# Patient Record
Sex: Female | Born: 1965 | Race: White | Hispanic: No | Marital: Married
Health system: Southern US, Community
[De-identification: ages and names within clinical notes are randomized; demographics above are authoritative.]

---

## 1997-10-06 ENCOUNTER — Other Ambulatory Visit: Admission: RE | Admit: 1997-10-06 | Discharge: 1997-10-06 | Payer: Self-pay | Admitting: Obstetrics and Gynecology

## 1999-07-26 ENCOUNTER — Other Ambulatory Visit: Admission: RE | Admit: 1999-07-26 | Discharge: 1999-07-26 | Payer: Self-pay | Admitting: Obstetrics and Gynecology

## 2000-01-04 ENCOUNTER — Other Ambulatory Visit: Admission: RE | Admit: 2000-01-04 | Discharge: 2000-01-04 | Payer: Self-pay | Admitting: Obstetrics and Gynecology

## 2000-07-17 ENCOUNTER — Inpatient Hospital Stay (HOSPITAL_COMMUNITY): Admission: AD | Admit: 2000-07-17 | Discharge: 2000-07-19 | Payer: Self-pay | Admitting: Obstetrics and Gynecology

## 2000-08-15 ENCOUNTER — Other Ambulatory Visit: Admission: RE | Admit: 2000-08-15 | Discharge: 2000-08-15 | Payer: Self-pay | Admitting: Obstetrics and Gynecology

## 2002-10-15 ENCOUNTER — Other Ambulatory Visit: Admission: RE | Admit: 2002-10-15 | Discharge: 2002-10-15 | Payer: Self-pay | Admitting: Obstetrics and Gynecology

## 2004-05-27 ENCOUNTER — Other Ambulatory Visit: Admission: RE | Admit: 2004-05-27 | Discharge: 2004-05-27 | Payer: Self-pay | Admitting: Obstetrics and Gynecology

## 2005-08-11 ENCOUNTER — Other Ambulatory Visit: Admission: RE | Admit: 2005-08-11 | Discharge: 2005-08-11 | Payer: Self-pay | Admitting: Obstetrics and Gynecology

## 2007-08-13 ENCOUNTER — Encounter: Admission: RE | Admit: 2007-08-13 | Discharge: 2007-08-13 | Payer: Self-pay | Admitting: Obstetrics and Gynecology

## 2007-08-20 ENCOUNTER — Encounter: Admission: RE | Admit: 2007-08-20 | Discharge: 2007-08-20 | Payer: Self-pay | Admitting: Obstetrics and Gynecology

## 2008-03-09 ENCOUNTER — Encounter: Admission: RE | Admit: 2008-03-09 | Discharge: 2008-03-09 | Payer: Self-pay | Admitting: Obstetrics and Gynecology

## 2008-08-13 ENCOUNTER — Encounter: Admission: RE | Admit: 2008-08-13 | Discharge: 2008-08-13 | Payer: Self-pay | Admitting: Obstetrics and Gynecology

## 2009-02-09 ENCOUNTER — Encounter: Admission: RE | Admit: 2009-02-09 | Discharge: 2009-02-09 | Payer: Self-pay | Admitting: Obstetrics and Gynecology

## 2009-08-19 ENCOUNTER — Encounter: Admission: RE | Admit: 2009-08-19 | Discharge: 2009-08-19 | Payer: Self-pay | Admitting: Obstetrics and Gynecology

## 2009-08-26 ENCOUNTER — Encounter: Admission: RE | Admit: 2009-08-26 | Discharge: 2009-08-26 | Payer: Self-pay | Admitting: Obstetrics and Gynecology

## 2010-07-11 ENCOUNTER — Other Ambulatory Visit: Payer: Self-pay | Admitting: Obstetrics and Gynecology

## 2010-07-11 DIAGNOSIS — Z1231 Encounter for screening mammogram for malignant neoplasm of breast: Secondary | ICD-10-CM

## 2010-09-05 ENCOUNTER — Ambulatory Visit
Admission: RE | Admit: 2010-09-05 | Discharge: 2010-09-05 | Disposition: A | Discharge status: Home / Self Care | Payer: PRIVATE HEALTH INSURANCE | Source: Ambulatory Visit | Attending: Obstetrics and Gynecology | Admitting: Obstetrics and Gynecology

## 2010-09-05 DIAGNOSIS — Z1231 Encounter for screening mammogram for malignant neoplasm of breast: Secondary | ICD-10-CM

## 2011-05-19 ENCOUNTER — Ambulatory Visit: Payer: Self-pay | Admitting: Obstetrics and Gynecology

## 2011-08-23 ENCOUNTER — Other Ambulatory Visit: Payer: Self-pay | Admitting: Obstetrics and Gynecology

## 2011-08-23 DIAGNOSIS — Z1231 Encounter for screening mammogram for malignant neoplasm of breast: Secondary | ICD-10-CM

## 2011-09-07 ENCOUNTER — Ambulatory Visit: Payer: PRIVATE HEALTH INSURANCE

## 2011-09-08 ENCOUNTER — Ambulatory Visit
Admission: RE | Admit: 2011-09-08 | Discharge: 2011-09-08 | Disposition: A | Discharge status: Home / Self Care | Payer: PRIVATE HEALTH INSURANCE | Source: Ambulatory Visit | Attending: Obstetrics and Gynecology | Admitting: Obstetrics and Gynecology

## 2011-09-08 DIAGNOSIS — Z1231 Encounter for screening mammogram for malignant neoplasm of breast: Secondary | ICD-10-CM

## 2013-04-07 ENCOUNTER — Other Ambulatory Visit: Payer: Self-pay

## 2013-04-07 DIAGNOSIS — Z1231 Encounter for screening mammogram for malignant neoplasm of breast: Secondary | ICD-10-CM

## 2013-04-24 ENCOUNTER — Ambulatory Visit: Payer: PRIVATE HEALTH INSURANCE

## 2013-05-16 ENCOUNTER — Ambulatory Visit: Payer: PRIVATE HEALTH INSURANCE

## 2013-06-16 ENCOUNTER — Ambulatory Visit
Admission: RE | Admit: 2013-06-16 | Discharge: 2013-06-16 | Disposition: A | Discharge status: Home / Self Care | Payer: BC Managed Care – PPO | Source: Ambulatory Visit

## 2013-06-16 DIAGNOSIS — Z1231 Encounter for screening mammogram for malignant neoplasm of breast: Secondary | ICD-10-CM

## 2015-02-28 IMAGING — MG MM DIGITAL SCREENING BILAT W/ CAD
4 series · 4 of 4 positions shown · non-contrast
Comparison: Previous exam(s).

CLINICAL DATA: Screening.

EXAM:
DIGITAL SCREENING BILATERAL MAMMOGRAM WITH CAD

[R CC]
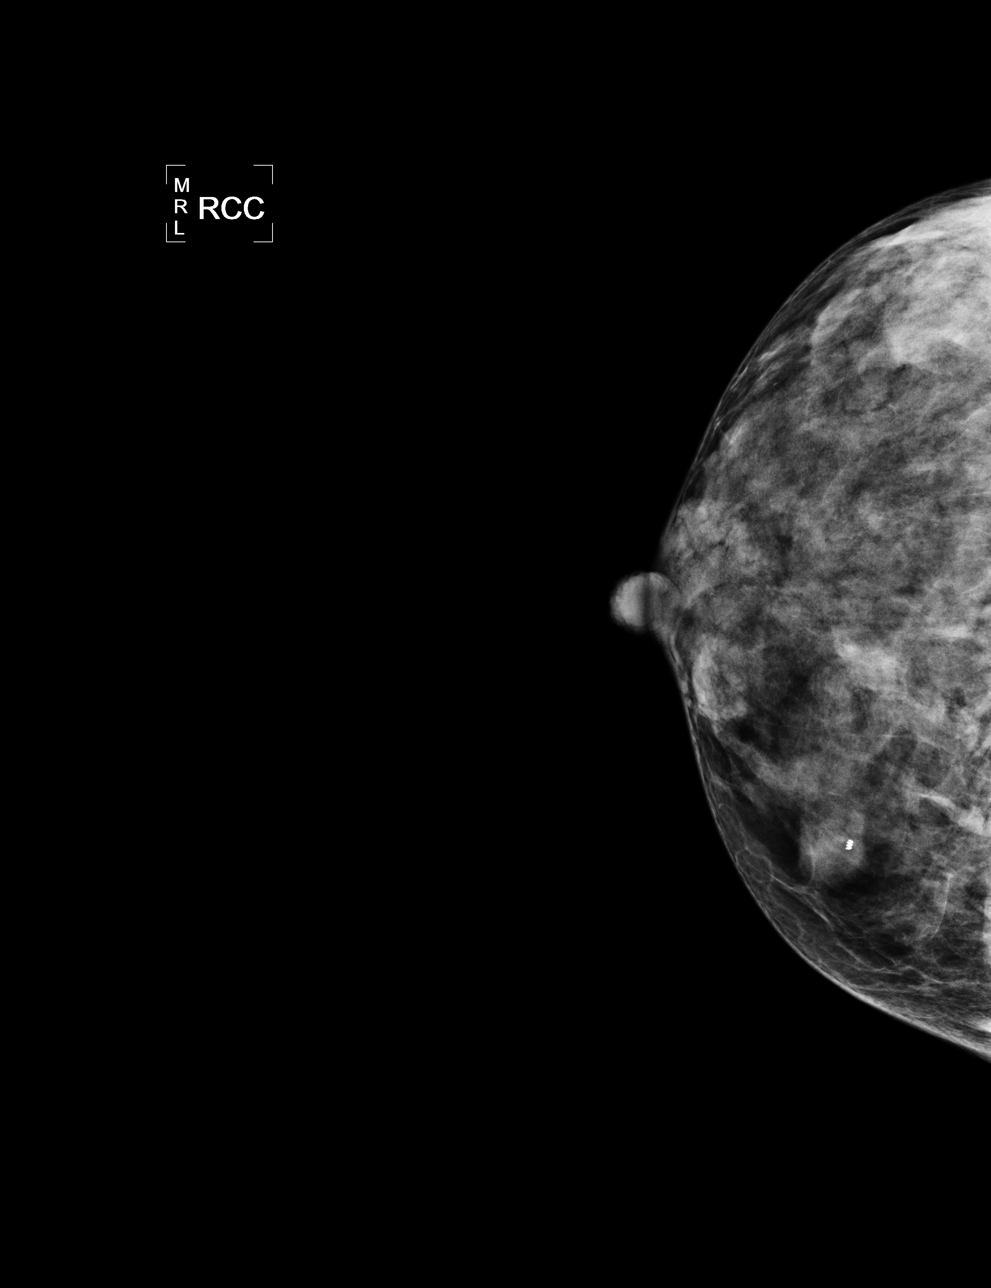

[L CC]
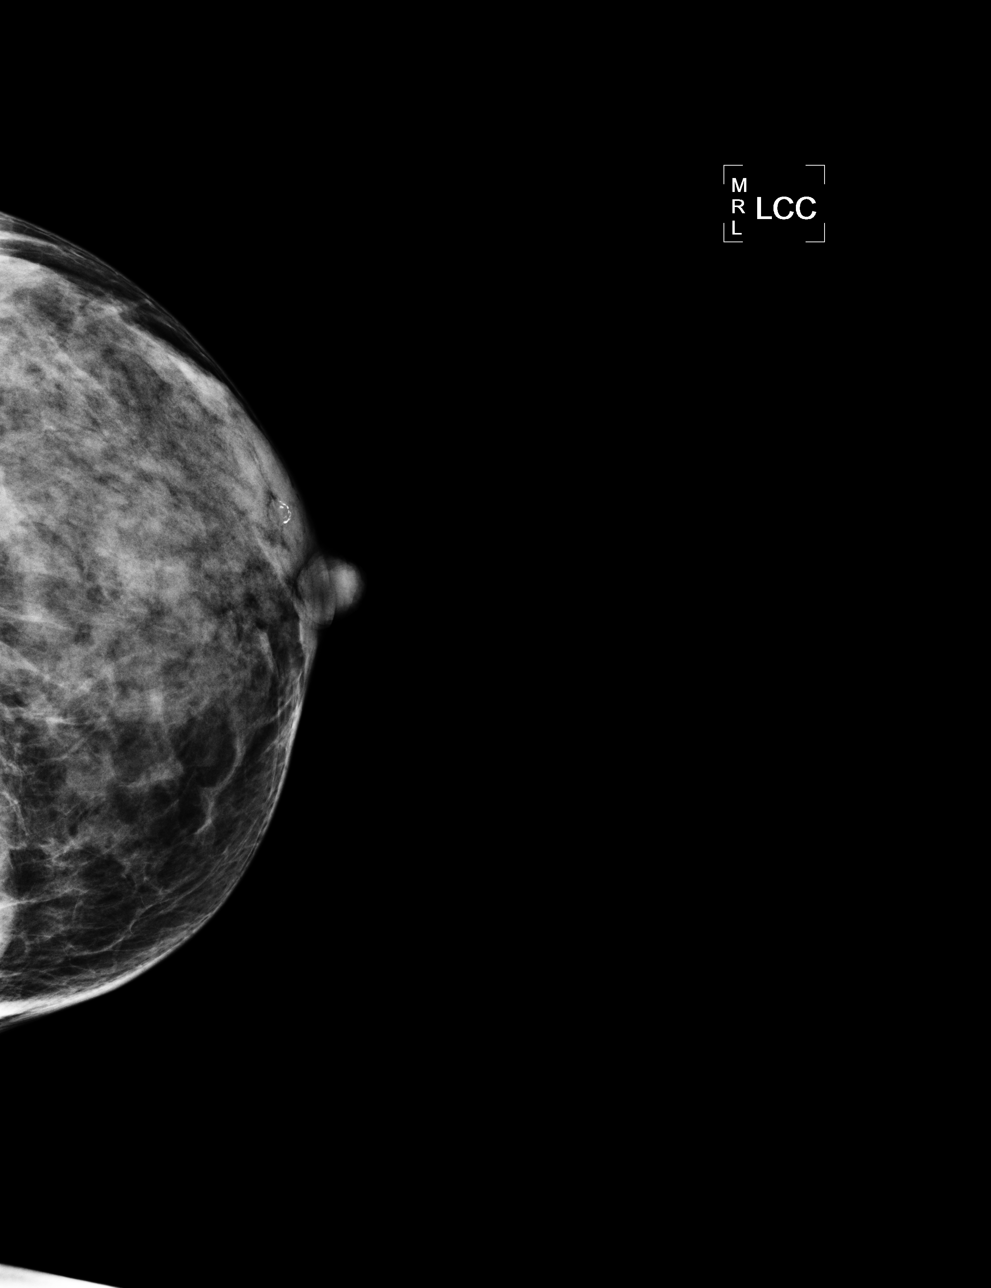

[L MLO]
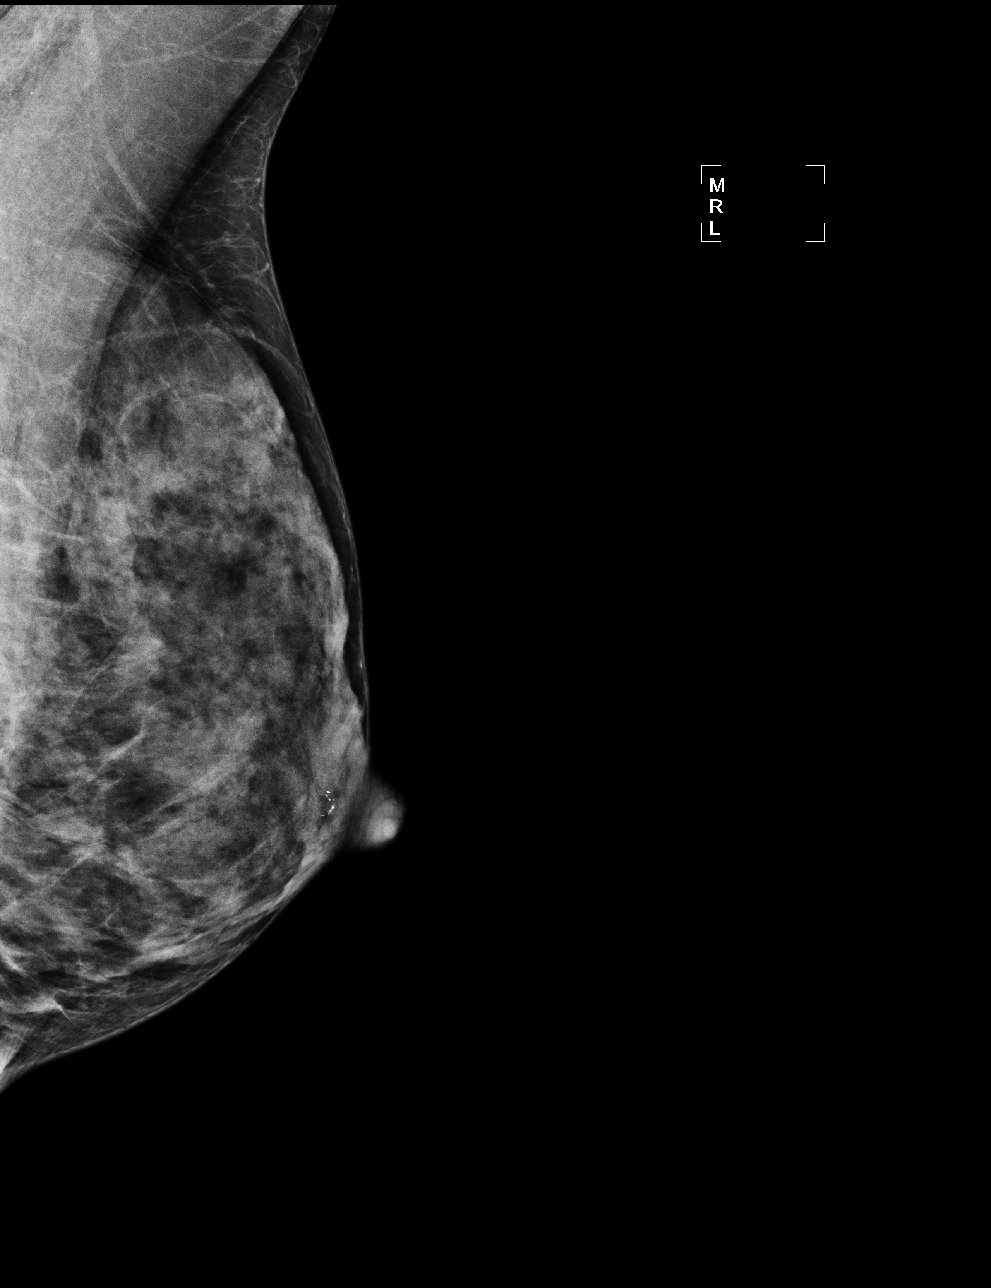

[R MLO]
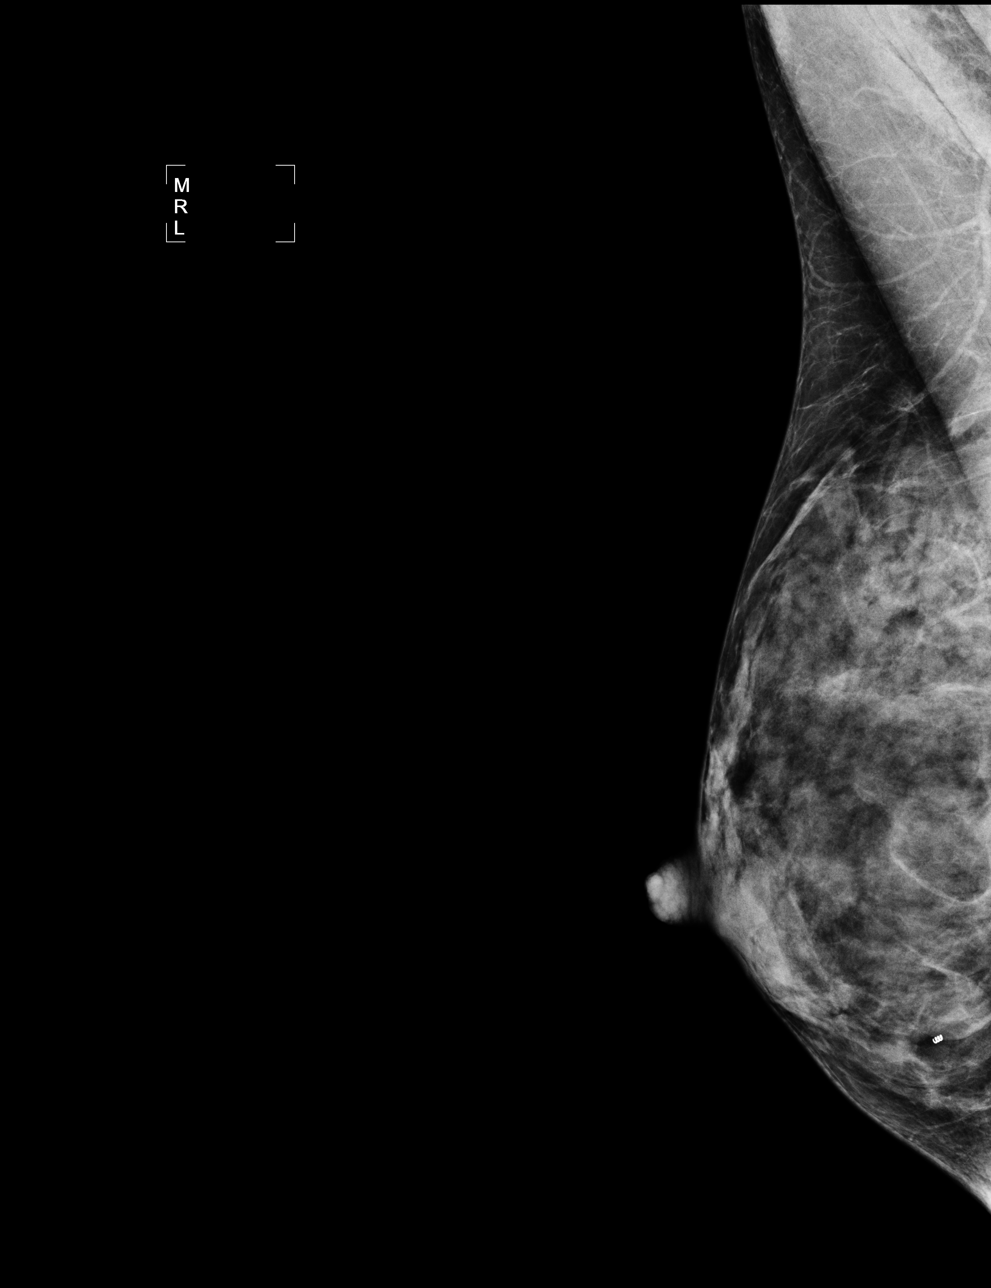

[4 of 4 positions shown; findings below may reference images not displayed]

ACR Breast Density Category d: The breast tissue is extremely dense,
which lowers the sensitivity of mammography.
FINDINGS: There are no findings suspicious for malignancy. Images were
processed with CAD.
IMPRESSION: No mammographic evidence of malignancy. A result letter of this
screening mammogram will be mailed directly to the patient.

RECOMMENDATION:
Screening mammogram in one year. (Code:[id number])

BI-RADS CATEGORY  1: Negative.

## 2021-11-15 ENCOUNTER — Telehealth: Payer: Self-pay | Admitting: Hematology and Oncology

## 2021-11-15 NOTE — Telephone Encounter (Signed)
Scheduled appt per 9/12 referral. Pt is aware of appt date and time. Pt is aware to arrive 15 mins prior to appt time and to bring and updated insurance card. Pt is aware of appt location.   

## 2021-11-30 ENCOUNTER — Other Ambulatory Visit: Payer: Self-pay | Admitting: Hematology and Oncology

## 2021-11-30 ENCOUNTER — Inpatient Hospital Stay: Payer: Self-pay | Attending: Hematology and Oncology | Admitting: Hematology and Oncology

## 2021-11-30 ENCOUNTER — Inpatient Hospital Stay: Payer: Self-pay

## 2021-11-30 ENCOUNTER — Other Ambulatory Visit: Payer: Self-pay

## 2021-11-30 ENCOUNTER — Encounter: Payer: Self-pay | Admitting: Hematology and Oncology

## 2021-11-30 VITALS — BP 149/91 | HR 105 | Temp 98.1°F | Resp 18 | Ht 64.0 in | Wt 122.1 lb

## 2021-11-30 DIAGNOSIS — D696 Thrombocytopenia, unspecified: Secondary | ICD-10-CM

## 2021-11-30 LAB — COMPREHENSIVE METABOLIC PANEL
ALT: 132 U/L — ABNORMAL HIGH (ref 0–44)
AST: 133 U/L — ABNORMAL HIGH (ref 15–41)
Albumin: 4.5 g/dL (ref 3.5–5.0)
Alkaline Phosphatase: 66 U/L (ref 38–126)
Anion gap: 7 (ref 5–15)
BUN: 21 mg/dL — ABNORMAL HIGH (ref 6–20)
CO2: 27 mmol/L (ref 22–32)
Calcium: 9.2 mg/dL (ref 8.9–10.3)
Chloride: 104 mmol/L (ref 98–111)
Creatinine, Ser: 0.83 mg/dL (ref 0.44–1.00)
GFR, Estimated: >60 mL/min (ref >60)
Glucose, Bld: 124 mg/dL — ABNORMAL HIGH (ref 70–99)
Potassium: 3.6 mmol/L (ref 3.5–5.1)
Sodium: 138 mmol/L (ref 135–145)
Total Bilirubin: 0.9 mg/dL (ref 0.3–1.2)
Total Protein: 6.8 g/dL (ref 6.5–8.1)

## 2021-11-30 LAB — CBC WITH DIFFERENTIAL/PLATELET
Abs Immature Granulocytes: 0.01 10*3/uL (ref 0.00–0.07)
Basophils Absolute: 0.1 10*3/uL (ref 0.0–0.1)
Basophils Relative: 2 %
Eosinophils Absolute: 0.1 10*3/uL (ref 0.0–0.5)
Eosinophils Relative: 2 %
HCT: 41.1 % (ref 36.0–46.0)
Hemoglobin: 14.9 g/dL (ref 12.0–15.0)
Immature Granulocytes: 0 %
Lymphocytes Relative: 21 %
Lymphs Abs: 1.2 10*3/uL (ref 0.7–4.0)
MCH: 33.8 pg (ref 26.0–34.0)
MCHC: 36.3 g/dL — ABNORMAL HIGH (ref 30.0–36.0)
MCV: 93.2 fL (ref 80.0–100.0)
Monocytes Absolute: 0.5 10*3/uL (ref 0.1–1.0)
Monocytes Relative: 9 %
Neutro Abs: 3.9 10*3/uL (ref 1.7–7.7)
Neutrophils Relative %: 66 %
Platelets: 129 10*3/uL — ABNORMAL LOW (ref 150–400)
RBC: 4.41 MIL/uL (ref 3.87–5.11)
RDW: 12.1 % (ref 11.5–15.5)
Smear Review: NORMAL
WBC: 5.9 10*3/uL (ref 4.0–10.5)
nRBC: 0 % (ref 0.0–0.2)

## 2021-11-30 LAB — TECHNOLOGIST SMEAR REVIEW: Plt Morphology: NORMAL

## 2021-11-30 LAB — HEPATITIS PANEL, ACUTE
HCV Ab: NONREACTIVE
Hep A IgM: NONREACTIVE
Hep B C IgM: NONREACTIVE
Hepatitis B Surface Ag: NONREACTIVE

## 2021-11-30 LAB — LACTATE DEHYDROGENASE: LDH: 188 U/L (ref 98–192)

## 2021-11-30 LAB — IRON AND IRON BINDING CAPACITY (CC-WL,HP ONLY)
Iron: 122 ug/dL (ref 28–170)
Saturation Ratios: 36 % — ABNORMAL HIGH (ref 10.4–31.8)
TIBC: 336 ug/dL (ref 250–450)
UIBC: 214 ug/dL (ref 148–442)

## 2021-11-30 LAB — FERRITIN: Ferritin: 88 ng/mL (ref 11–307)

## 2021-11-30 LAB — VITAMIN B12: Vitamin B-12: 802 pg/mL (ref 180–914)

## 2021-11-30 NOTE — Progress Notes (Signed)
[place] NOTE  Patient Care Team: [facility], [facility] as PCP - General  CHIEF COMPLAINTS/PURPOSE OF CONSULTATION:  Thrombocytopenia  ASSESSMENT & PLAN:   This is a very pleasant 56 year old female patient who was referred to hematology for evaluation of thrombocytopenia.  We have discussed the following findings about thrombocytopenia. Thrombocytopenia is defined as a platelet count below the lower limit of normal (ie, <150,000/microL [150 x 109/L] for adults).  Degrees of thrombocytopenia can be further subdivided into mild (platelet count 100,000 to 150,000/microL), moderate (50,000 to 99,000/microL), and severe (<50,000/microL) Most common causes of thrombocytopenia include but not limited to chronic liver disease or hypersplenism, immune thrombocytopenia, viral infections such as Hepatitis, HIV, active bacterial infections, autoimmune diseases, alcohol, nutritional deficiencies and medications. Rarely bone marrow disorders such as myelodysplatic syndrome, bone marrow failure syndromes, acute leukemia and PNH can present with thrombocytopenia. Additional rare causes of thrombocytopenia include vascular conditions associated with platelet destruction (eg, giant capillary hemangioma, large aortic aneurysms, cardiopulmonary bypass, intraaortic balloon pumps.  We will proceed with some laboratory evaluation today.  She will have a televisit in about a week to 10 days to review the results and to discuss other treatment recommendations.  She is otherwise up-to-date with age-appropriate cancer screening. Thank you for consulting [place] the care of this patient.  Please do not hesitate to contact us with any additional questions or concerns.  HISTORY OF PRESENTING ILLNESS:   This is a very pleasant 56 year old female patient with no significant past medical history referred to hematology for evaluation of thrombocytopenia.  According to the patient, this  thrombocytopenia was most recently noted on an annual visit.  She denies any health complaints.  No new medications, recent infections or known autoimmune diseases.  She was taking some over-the-counter vitamins which she has now discontinued.  No fevers or drenching night sweats, has some hot flashes from postmenopausal symptoms.  No unintentional weight loss.  She works for [employer]. She denies any change in breathing.  No bleeding in her stool or in her urine.  No new neurological complaints.  Rest of the pertinent 10 point ROS reviewed and negative  REVIEW OF SYSTEMS:   Constitutional: Denies fevers, chills or abnormal night sweats Eyes: Denies blurriness of vision, double vision or watery eyes Ears, nose, mouth, throat, and face: Denies mucositis or sore throat Respiratory: Denies cough, dyspnea or wheezes Cardiovascular: Denies palpitation, chest discomfort or lower extremity swelling Gastrointestinal:  Denies nausea, heartburn or change in bowel habits Skin: Denies abnormal skin rashes Lymphatics: Denies new lymphadenopathy or easy bruising Neurological:Denies numbness, tingling or new weaknesses Behavioral/Psych: Mood is stable, no new changes  All other systems were reviewed with the patient and are negative.  MEDICAL HISTORY:  No significant PMH  SURGICAL HISTORY: History reviewed. No pertinent surgical history.  SOCIAL HISTORY: Social History   Socioeconomic History   Marital status: Married    Spouse name: Not on file   Number of children: Not on file   Years of education: Not on file   Highest education level: Not on file  Occupational History   Not on file  Tobacco Use   Smoking status: Not on file   Smokeless tobacco: Not on file  Substance and Sexual Activity   Alcohol use: Not on file   Drug use: Not on file   Sexual activity: Not on file  Other Topics Concern   Not on file  Social History Narrative   Not on file  Social Determinants of Health    Financial Resource Strain: Not on file  Food Insecurity: Not on file  Transportation Needs: Not on file  Physical Activity: Not on file  Stress: Not on file  Social Connections: Not on file  Intimate Partner Violence: Not on file    FAMILY HISTORY: History reviewed. No pertinent family history.  ALLERGIES:  has No Known Allergies.  MEDICATIONS:  No current outpatient medications on file.   No current facility-administered medications for this visit.     PHYSICAL EXAMINATION: ECOG PERFORMANCE STATUS: 0 - Asymptomatic  Vitals:   11/30/21 1050  BP: (!) 149/91  Pulse: (!) 105  Resp: 18  Temp: 98.1 F (36.7 C)  SpO2: 99%   Filed Weights   11/30/21 1050  Weight: 122 lb 1.6 oz (55.4 kg)    Physical Exam Constitutional:      Appearance: Normal appearance.  Cardiovascular:     Rate and Rhythm: Normal rate and regular rhythm.  Pulmonary:     Effort: Pulmonary effort is normal. No respiratory distress.     Breath sounds: Normal breath sounds.  Abdominal:     General: Abdomen is flat. Bowel sounds are normal.  Musculoskeletal:     Cervical back: Normal range of motion and neck supple. No rigidity.  Lymphadenopathy:     Cervical: No cervical adenopathy.  Skin:    General: Skin is warm and dry.  Neurological:     General: No focal deficit present.     Mental Status: She is alert.  Psychiatric:        Mood and Affect: Mood normal.    LABORATORY DATA:  I have reviewed the data as listed No results found for: "WBC", "HGB", "HCT", "MCV", "PLT"   Chemistry   No results found for: "NA", "K", "CL", "CO2", "BUN", "CREATININE", "GLU" No results found for: "CALCIUM", "ALKPHOS", "AST", "ALT", "BILITOT"     RADIOGRAPHIC STUDIES: I have personally reviewed the radiological images as listed and agreed with the findings in the report. No results found.  All questions were answered. The patient knows to call the clinic with any problems, questions or concerns. I spent  45 minutes in the care of this patient including H and P, review of records, counseling and coordination of care.     Rachel Moulds, MD 11/30/2021 11:14 AM

## 2021-12-01 ENCOUNTER — Other Ambulatory Visit: Payer: Self-pay | Admitting: Hematology and Oncology

## 2021-12-01 DIAGNOSIS — D696 Thrombocytopenia, unspecified: Secondary | ICD-10-CM

## 2021-12-01 LAB — FOLATE RBC
Folate, Hemolysate: 544 ng/mL
Folate, RBC: 1242 ng/mL (ref >498)
Hematocrit: 43.8 % (ref 34.0–46.6)

## 2021-12-01 NOTE — Progress Notes (Signed)
CBC and CMP orders placed.

## 2021-12-02 LAB — ANTINUCLEAR ANTIBODIES, IFA: ANA Ab, IFA: NEGATIVE

## 2021-12-06 ENCOUNTER — Encounter: Payer: Self-pay | Admitting: Hematology and Oncology

## 2021-12-09 ENCOUNTER — Inpatient Hospital Stay: Payer: Self-pay

## 2021-12-09 DIAGNOSIS — D696 Thrombocytopenia, unspecified: Secondary | ICD-10-CM

## 2021-12-09 LAB — COMPREHENSIVE METABOLIC PANEL
ALT: 75 U/L — ABNORMAL HIGH (ref 0–44)
AST: 51 U/L — ABNORMAL HIGH (ref 15–41)
Albumin: 4.2 g/dL (ref 3.5–5.0)
Alkaline Phosphatase: 60 U/L (ref 38–126)
Anion gap: 6 (ref 5–15)
BUN: 13 mg/dL (ref 6–20)
CO2: 27 mmol/L (ref 22–32)
Calcium: 9.1 mg/dL (ref 8.9–10.3)
Chloride: 106 mmol/L (ref 98–111)
Creatinine, Ser: 0.69 mg/dL (ref 0.44–1.00)
GFR, Estimated: >60 mL/min (ref >60)
Glucose, Bld: 118 mg/dL — ABNORMAL HIGH (ref 70–99)
Potassium: 3.6 mmol/L (ref 3.5–5.1)
Sodium: 139 mmol/L (ref 135–145)
Total Bilirubin: 0.8 mg/dL (ref 0.3–1.2)
Total Protein: 6.8 g/dL (ref 6.5–8.1)

## 2021-12-09 LAB — CBC WITH DIFFERENTIAL/PLATELET
Abs Immature Granulocytes: 0.01 10*3/uL (ref 0.00–0.07)
Basophils Absolute: 0.1 10*3/uL (ref 0.0–0.1)
Basophils Relative: 2 %
Eosinophils Absolute: 0.1 10*3/uL (ref 0.0–0.5)
Eosinophils Relative: 3 %
HCT: 40.6 % (ref 36.0–46.0)
Hemoglobin: 14.5 g/dL (ref 12.0–15.0)
Immature Granulocytes: 0 %
Lymphocytes Relative: 29 %
Lymphs Abs: 1.4 10*3/uL (ref 0.7–4.0)
MCH: 33.6 pg (ref 26.0–34.0)
MCHC: 35.7 g/dL (ref 30.0–36.0)
MCV: 94 fL (ref 80.0–100.0)
Monocytes Absolute: 0.5 10*3/uL (ref 0.1–1.0)
Monocytes Relative: 9 %
Neutro Abs: 2.8 10*3/uL (ref 1.7–7.7)
Neutrophils Relative %: 57 %
Platelets: 126 10*3/uL — ABNORMAL LOW (ref 150–400)
RBC: 4.32 MIL/uL (ref 3.87–5.11)
RDW: 12.4 % (ref 11.5–15.5)
WBC: 4.8 10*3/uL (ref 4.0–10.5)
nRBC: 0 % (ref 0.0–0.2)

## 2021-12-16 ENCOUNTER — Telehealth: Payer: Self-pay | Admitting: *Deleted

## 2021-12-16 NOTE — Telephone Encounter (Addendum)
-----   Message from Benay Pike, MD sent at 12/13/2021  4:22 PM EDT ----- Looks like LFT has improved with reduction in alcohol consumption.  She should minimize alcohol consumption to 1 glass a day or up to 3 times a week.  Platelet count is stable.    We can move the October 24 appointment to maybe December for follow-up.  This RN called pt and obtained identified VM- message left stating noted improvement and to continue her current plan. This RN left her name and return call number.

## 2021-12-20 ENCOUNTER — Inpatient Hospital Stay (HOSPITAL_BASED_OUTPATIENT_CLINIC_OR_DEPARTMENT_OTHER): Payer: Self-pay | Admitting: Hematology and Oncology

## 2021-12-20 ENCOUNTER — Encounter: Payer: Self-pay | Admitting: Hematology and Oncology

## 2021-12-20 DIAGNOSIS — D696 Thrombocytopenia, unspecified: Secondary | ICD-10-CM

## 2021-12-20 NOTE — Progress Notes (Signed)
[facility] CONSULT NOTE  Patient Care Team: Associates, [facility] Phys[facility] And as PCP - General  CHIEF COMPLAINTS/PURPOSE OF CONSULTATION:  Thrombocytopenia  ASSESSMENT & PLAN:   This is a very pleasant 56 year old female patient who was referred to hematology for evaluation of thrombocytopenia.  She is here for telephone visit to follow-up on her thrombocytopenia.  She is feeling really well, no complaints at all.  We have reviewed the labs which showed mild thrombocytopenia.  No evidence of nutritional deficiency, hemolysis, autoimmune disease.  She has cut down drinking since last visit, LFT's has improved.  I wonder if the thrombocytopenia could also be related to some degree of liver disease versus ITP.  We have discussed about following up with labs in 8 weeks, cutting down on alcohol consumption, avoiding other form of hepatotoxins and return to clinic for follow-up. She is in agreement with this plan Thank you for consulting [place] the care of this patient.  Please do not hesitate to contact us with any additional questions or concerns.  HISTORY OF PRESENTING ILLNESS:   This is a very pleasant 56 year old female patient with no significant past medical history referred to hematology for evaluation of thrombocytopenia. She has been drinking 1 glass of wine and 1 shot a day, sometimes more on weekends however since our last conversation, she has cut down on drinking tremendously. She is otherwise feeling very well.  REVIEW OF SYSTEMS:   Constitutional: Denies fevers, chills or abnormal night sweats Eyes: Denies blurriness of vision, double vision or watery eyes Ears, nose, mouth, throat, and face: Denies mucositis or sore throat Respiratory: Denies cough, dyspnea or wheezes Cardiovascular: Denies palpitation, chest discomfort or lower extremity swelling Gastrointestinal:  Denies nausea, heartburn or change in bowel habits Skin: Denies abnormal skin rashes Lymphatics:  Denies new lymphadenopathy or easy bruising Neurological:Denies numbness, tingling or new weaknesses Behavioral/Psych: Mood is stable, no new changes  All other systems were reviewed with the patient and are negative.  MEDICAL HISTORY:  No significant PMH  SURGICAL HISTORY: No past surgical history on file.  SOCIAL HISTORY: Social History   Socioeconomic History   Marital status: Married    Spouse name: Not on file   Number of children: Not on file   Years of education: Not on file   Highest education level: Not on file  Occupational History   Not on file  Tobacco Use   Smoking status: Not on file   Smokeless tobacco: Not on file  Substance and Sexual Activity   Alcohol use: Not on file   Drug use: Not on file   Sexual activity: Not on file  Other Topics Concern   Not on file  Social History Narrative   ** Merged History Encounter **       Social Determinants of Health   Financial Resource Strain: Not on file  Food Insecurity: Not on file  Transportation Needs: Not on file  Physical Activity: Not on file  Stress: Not on file  Social Connections: Not on file  Intimate Partner Violence: Not on file    FAMILY HISTORY: No family history on file.  ALLERGIES:  has No Known Allergies.  MEDICATIONS:  No current outpatient medications on file.   No current facility-administered medications for this visit.     PHYSICAL EXAMINATION: ECOG PERFORMANCE STATUS: 0 - Asymptomatic  There were no vitals filed for this visit.  There were no vitals filed for this visit.   Physical examination not done, telephone visit LABORATORY  DATA:  I have reviewed the data as listed Lab Results  Component Value Date   WBC 4.8 12/09/2021   HGB 14.5 12/09/2021   HCT 40.6 12/09/2021   MCV 94.0 12/09/2021   PLT 126 (L) 12/09/2021     Chemistry      Component Value Date/Time   NA 139 12/09/2021 1516   K 3.6 12/09/2021 1516   CL 106 12/09/2021 1516   CO2 27 12/09/2021 1516    BUN 13 12/09/2021 1516   CREATININE 0.69 12/09/2021 1516      Component Value Date/Time   CALCIUM 9.1 12/09/2021 1516   ALKPHOS 60 12/09/2021 1516   AST 51 (H) 12/09/2021 1516   ALT 75 (H) 12/09/2021 1516   BILITOT 0.8 12/09/2021 1516       RADIOGRAPHIC STUDIES: I have personally reviewed the radiological images as listed and agreed with the findings in the report. No results found.  All questions were answered. The patient knows to call the clinic with any problems, questions or concerns. I spent 11 minutes in the care of this patient including H and P, review of records, counseling and coordination of care.  I connected with  Laurann Montana on 12/20/21 by a telephone application and verified that I am speaking with the correct person using two identifiers.   I discussed the limitations of evaluation and management by telemedicine. The patient expressed understanding and agreed to proceed.     Benay Pike, MD 12/20/2021 2:37 PM

## 2022-02-09 ENCOUNTER — Other Ambulatory Visit: Payer: Self-pay

## 2022-02-09 ENCOUNTER — Ambulatory Visit: Payer: Self-pay | Admitting: Hematology and Oncology

## 2023-09-03 ENCOUNTER — Other Ambulatory Visit: Payer: Self-pay | Admitting: Obstetrics and Gynecology

## 2023-09-03 DIAGNOSIS — Z1231 Encounter for screening mammogram for malignant neoplasm of breast: Secondary | ICD-10-CM

## 2023-11-22 ENCOUNTER — Ambulatory Visit
Admission: RE | Admit: 2023-11-22 | Discharge: 2023-11-22 | Disposition: A | Discharge status: Home / Self Care | Payer: Self-pay | Source: Ambulatory Visit | Attending: Obstetrics and Gynecology | Admitting: Obstetrics and Gynecology

## 2023-11-22 DIAGNOSIS — Z1231 Encounter for screening mammogram for malignant neoplasm of breast: Secondary | ICD-10-CM
